# Patient Record
Sex: Male | Born: 1955 | Race: Black or African American | Hispanic: No | Marital: Single | Smoking: Current every day smoker
Health system: Southern US, Community
[De-identification: ages and names within clinical notes are randomized; demographics above are authoritative.]

## PROBLEM LIST (undated history)

## (undated) DIAGNOSIS — R7303 Prediabetes: Secondary | ICD-10-CM

## (undated) DIAGNOSIS — Z8739 Personal history of other diseases of the musculoskeletal system and connective tissue: Secondary | ICD-10-CM

## (undated) DIAGNOSIS — M0800 Unspecified juvenile rheumatoid arthritis of unspecified site: Secondary | ICD-10-CM

## (undated) DIAGNOSIS — Z8614 Personal history of Methicillin resistant Staphylococcus aureus infection: Secondary | ICD-10-CM

## (undated) DIAGNOSIS — I10 Essential (primary) hypertension: Secondary | ICD-10-CM

## (undated) DIAGNOSIS — Z96659 Presence of unspecified artificial knee joint: Secondary | ICD-10-CM

## (undated) DIAGNOSIS — K219 Gastro-esophageal reflux disease without esophagitis: Secondary | ICD-10-CM

## (undated) DIAGNOSIS — E7800 Pure hypercholesterolemia, unspecified: Secondary | ICD-10-CM

## (undated) HISTORY — DX: Essential (primary) hypertension: I10

## (undated) HISTORY — DX: Presence of unspecified artificial knee joint: Z96.659

## (undated) HISTORY — DX: Unspecified juvenile rheumatoid arthritis of unspecified site: M08.00

## (undated) HISTORY — DX: Personal history of Methicillin resistant Staphylococcus aureus infection: Z86.14

## (undated) HISTORY — DX: Gastro-esophageal reflux disease without esophagitis: K21.9

## (undated) HISTORY — DX: Prediabetes: R73.03

## (undated) HISTORY — DX: Pure hypercholesterolemia, unspecified: E78.00

## (undated) HISTORY — DX: Personal history of other diseases of the musculoskeletal system and connective tissue: Z87.39

---

## 2004-09-07 ENCOUNTER — Emergency Department: Payer: Self-pay | Admitting: Internal Medicine

## 2007-02-20 ENCOUNTER — Emergency Department: Payer: Self-pay | Admitting: Emergency Medicine

## 2008-09-15 HISTORY — PX: ELBOW SURGERY: SHX618

## 2008-09-15 HISTORY — PX: KNEE SURGERY: SHX244

## 2009-04-25 ENCOUNTER — Emergency Department: Payer: Self-pay | Admitting: Emergency Medicine

## 2009-04-27 ENCOUNTER — Inpatient Hospital Stay: Payer: Self-pay | Admitting: Internal Medicine

## 2009-06-16 ENCOUNTER — Ambulatory Visit: Payer: Self-pay

## 2009-06-29 ENCOUNTER — Emergency Department (HOSPITAL_COMMUNITY): Admission: EM | Admit: 2009-06-29 | Discharge: 2009-06-29 | Payer: Self-pay | Admitting: Emergency Medicine

## 2009-07-14 ENCOUNTER — Emergency Department (HOSPITAL_COMMUNITY): Admission: EM | Admit: 2009-07-14 | Discharge: 2009-07-14 | Payer: Self-pay | Admitting: Emergency Medicine

## 2009-07-17 ENCOUNTER — Ambulatory Visit: Payer: Self-pay | Admitting: Radiology

## 2009-09-25 ENCOUNTER — Ambulatory Visit: Payer: Self-pay

## 2009-12-20 ENCOUNTER — Emergency Department (HOSPITAL_COMMUNITY): Admission: EM | Admit: 2009-12-20 | Discharge: 2009-12-20 | Payer: Self-pay | Admitting: Emergency Medicine

## 2010-06-08 ENCOUNTER — Other Ambulatory Visit: Payer: Self-pay

## 2010-06-14 ENCOUNTER — Ambulatory Visit: Payer: Self-pay

## 2010-06-22 ENCOUNTER — Emergency Department (HOSPITAL_COMMUNITY): Admission: EM | Admit: 2010-06-22 | Discharge: 2010-06-23 | Payer: Self-pay | Admitting: Emergency Medicine

## 2010-06-23 ENCOUNTER — Other Ambulatory Visit: Payer: Self-pay

## 2010-07-04 ENCOUNTER — Ambulatory Visit (HOSPITAL_COMMUNITY): Admission: RE | Admit: 2010-07-04 | Discharge: 2010-07-04 | Payer: Self-pay | Admitting: Internal Medicine

## 2010-07-23 ENCOUNTER — Inpatient Hospital Stay: Payer: Self-pay | Admitting: Family Medicine

## 2010-07-31 ENCOUNTER — Other Ambulatory Visit: Payer: Self-pay | Admitting: Family Medicine

## 2010-08-02 ENCOUNTER — Ambulatory Visit: Payer: Self-pay | Admitting: Family Medicine

## 2010-08-05 ENCOUNTER — Other Ambulatory Visit: Payer: Self-pay | Admitting: Family Medicine

## 2010-08-09 ENCOUNTER — Ambulatory Visit: Payer: Self-pay

## 2010-08-30 ENCOUNTER — Other Ambulatory Visit: Payer: Self-pay | Admitting: Family Medicine

## 2010-12-04 LAB — CBC
HCT: 38.1 % — ABNORMAL LOW (ref 39.0–52.0)
Hemoglobin: 12.5 g/dL — ABNORMAL LOW (ref 13.0–17.0)
MCV: 76.3 fL — ABNORMAL LOW (ref 78.0–100.0)
RDW: 19.5 % — ABNORMAL HIGH (ref 11.5–15.5)

## 2010-12-04 LAB — DIFFERENTIAL
Basophils Absolute: 0 10*3/uL (ref 0.0–0.1)
Eosinophils Absolute: 0.5 10*3/uL (ref 0.0–0.7)
Eosinophils Relative: 5 % (ref 0–5)
Lymphocytes Relative: 23 % (ref 12–46)
Monocytes Absolute: 0.6 10*3/uL (ref 0.1–1.0)

## 2010-12-04 LAB — BASIC METABOLIC PANEL
CO2: 23 mEq/L (ref 19–32)
Chloride: 98 mEq/L (ref 96–112)
GFR calc non Af Amer: >60 mL/min (ref >60)
Glucose, Bld: 107 mg/dL — ABNORMAL HIGH (ref 70–99)
Potassium: 4 mEq/L (ref 3.5–5.1)
Sodium: 129 mEq/L — ABNORMAL LOW (ref 135–145)

## 2010-12-04 LAB — POCT CARDIAC MARKERS: Troponin i, poc: <0.05 ng/mL (ref 0.00–0.09)

## 2010-12-19 LAB — CBC
HCT: 26.2 % — ABNORMAL LOW (ref 39.0–52.0)
MCV: 76.2 fL — ABNORMAL LOW (ref 78.0–100.0)
Platelets: 682 10*3/uL — ABNORMAL HIGH (ref 150–400)
RDW: 19.3 % — ABNORMAL HIGH (ref 11.5–15.5)

## 2010-12-19 LAB — POCT CARDIAC MARKERS

## 2010-12-19 LAB — BASIC METABOLIC PANEL
BUN: 6 mg/dL (ref 6–23)
Chloride: 94 mEq/L — ABNORMAL LOW (ref 96–112)
GFR calc Af Amer: >60 mL/min (ref >60)
Potassium: 3.6 mEq/L (ref 3.5–5.1)

## 2010-12-19 LAB — TYPE AND SCREEN
ABO/RH(D): O POS
Antibody Screen: NEGATIVE

## 2010-12-19 LAB — DIFFERENTIAL
Basophils Absolute: 0.1 10*3/uL (ref 0.0–0.1)
Eosinophils Absolute: 0.7 10*3/uL (ref 0.0–0.7)
Eosinophils Relative: 4 % (ref 0–5)

## 2010-12-19 LAB — ABO/RH: ABO/RH(D): O POS

## 2013-04-15 DIAGNOSIS — Z96659 Presence of unspecified artificial knee joint: Secondary | ICD-10-CM

## 2013-04-15 HISTORY — DX: Presence of unspecified artificial knee joint: Z96.659

## 2013-07-01 ENCOUNTER — Ambulatory Visit: Payer: Self-pay | Admitting: Internal Medicine

## 2014-03-10 DIAGNOSIS — Z792 Long term (current) use of antibiotics: Secondary | ICD-10-CM | POA: Insufficient documentation

## 2015-01-05 NOTE — Op Note (Signed)
PATIENT NAME:  Daniel Ray, Daniel Ray MR#:  [id number] DATE OF BIRTH:  1956-07-09  DGearldine BienenstockE OF PROCEDURE:  07/01/2013  PREOPERATIVE DIAGNOSES:  Methicillin-resistant Staphylococcus aureus infection.   POSTOPERATIVE DIAGNOSIS:  Methicillin-resistant Staphylococcus aureus infection.  PROCEDURES:  1. Ultrasound guidance for vascular access to right brachial vein.  2. Fluoroscopic guidance for placement of catheter.  3. Insertion of peripherally inserted central venous catheter, single lumen PICC line, right arm.  SURGEON: Levora DredgeGregory Schnier. MD  ANESTHESIA: Local.   ESTIMATED BLOOD LOSS: Minimal.   INDICATION FOR PROCEDURE: Requiring IV antibiotics greater than 5 days.   DESCRIPTION OF PROCEDURE: The patient's right arm was sterilely prepped and draped, and a sterile surgical field was created. The brachial vein was accessed under direct ultrasound guidance without difficulty with a micropuncture needle and permanent image was recorded. 0.018 wire was then placed into the superior vena cava. Peel-away sheath was placed over the wire. A single lumen peripherally inserted central venous catheter was then placed over the wire and the wire and peel-away sheath were removed. The catheter tip was placed into the superior vena cava and was secured at the skin at 40 cm with a sterile dressing. The catheter withdrew blood well and flushed easily with heparinized saline. The patient tolerated procedure well.    ____________________________ Renford DillsGregory G. Schnier, MD ggs:dp D: 07/01/2013 13:52:37 ET T: 07/01/2013 14:22:26 ET JOB#: [id number]  cc: Renford DillsGregory G. Schnier, MD, <Dictator> Renford DillsGREGORY G SCHNIER MD ELECTRONICALLY SIGNED 07/22/2013 8:14

## 2015-07-12 DIAGNOSIS — I10 Essential (primary) hypertension: Secondary | ICD-10-CM | POA: Insufficient documentation

## 2015-07-12 HISTORY — DX: Essential (primary) hypertension: I10

## 2015-08-03 ENCOUNTER — Encounter: Admission: RE | Payer: Self-pay | Source: Ambulatory Visit

## 2015-08-03 ENCOUNTER — Ambulatory Visit
Admission: RE | Admit: 2015-08-03 | Payer: Medicare Other | Source: Ambulatory Visit | Admitting: Unknown Physician Specialty

## 2015-08-03 SURGERY — COLONOSCOPY WITH PROPOFOL
Anesthesia: General

## 2016-01-10 DIAGNOSIS — F17200 Nicotine dependence, unspecified, uncomplicated: Secondary | ICD-10-CM | POA: Insufficient documentation

## 2016-07-14 DIAGNOSIS — E7800 Pure hypercholesterolemia, unspecified: Secondary | ICD-10-CM

## 2016-07-14 DIAGNOSIS — Z7189 Other specified counseling: Secondary | ICD-10-CM | POA: Insufficient documentation

## 2016-07-14 DIAGNOSIS — R7303 Prediabetes: Secondary | ICD-10-CM

## 2016-07-14 DIAGNOSIS — Z7185 Encounter for immunization safety counseling: Secondary | ICD-10-CM | POA: Insufficient documentation

## 2016-07-14 HISTORY — DX: Pure hypercholesterolemia, unspecified: E78.00

## 2016-07-14 HISTORY — DX: Prediabetes: R73.03

## 2016-11-28 ENCOUNTER — Other Ambulatory Visit: Payer: Self-pay | Admitting: Nurse Practitioner

## 2016-11-28 DIAGNOSIS — R945 Abnormal results of liver function studies: Secondary | ICD-10-CM

## 2016-11-28 DIAGNOSIS — Z789 Other specified health status: Secondary | ICD-10-CM

## 2016-11-28 DIAGNOSIS — R1011 Right upper quadrant pain: Secondary | ICD-10-CM

## 2016-11-28 DIAGNOSIS — R7989 Other specified abnormal findings of blood chemistry: Secondary | ICD-10-CM

## 2016-11-28 DIAGNOSIS — K219 Gastro-esophageal reflux disease without esophagitis: Secondary | ICD-10-CM

## 2016-12-03 ENCOUNTER — Other Ambulatory Visit: Payer: Medicare Other

## 2016-12-03 ENCOUNTER — Ambulatory Visit: Payer: Medicare Other

## 2016-12-11 ENCOUNTER — Ambulatory Visit
Admission: RE | Admit: 2016-12-11 | Discharge: 2016-12-11 | Disposition: A | Discharge status: Home / Self Care | Payer: Medicare Other | Source: Ambulatory Visit | Attending: Nurse Practitioner | Admitting: Nurse Practitioner

## 2016-12-11 DIAGNOSIS — R1011 Right upper quadrant pain: Secondary | ICD-10-CM

## 2016-12-11 DIAGNOSIS — K219 Gastro-esophageal reflux disease without esophagitis: Secondary | ICD-10-CM

## 2016-12-11 DIAGNOSIS — Z789 Other specified health status: Secondary | ICD-10-CM

## 2016-12-11 DIAGNOSIS — R7989 Other specified abnormal findings of blood chemistry: Secondary | ICD-10-CM

## 2016-12-11 DIAGNOSIS — N281 Cyst of kidney, acquired: Secondary | ICD-10-CM | POA: Diagnosis not present

## 2016-12-11 DIAGNOSIS — R945 Abnormal results of liver function studies: Secondary | ICD-10-CM

## 2017-01-15 ENCOUNTER — Ambulatory Visit (INDEPENDENT_AMBULATORY_CARE_PROVIDER_SITE_OTHER): Payer: Medicare Other | Admitting: Urology

## 2017-01-15 ENCOUNTER — Encounter: Payer: Self-pay | Admitting: Urology

## 2017-01-15 VITALS — BP 152/89 | HR 63 | Ht 65.0 in | Wt 240.0 lb

## 2017-01-15 DIAGNOSIS — N281 Cyst of kidney, acquired: Secondary | ICD-10-CM

## 2017-01-15 DIAGNOSIS — Z8614 Personal history of Methicillin resistant Staphylococcus aureus infection: Secondary | ICD-10-CM

## 2017-01-15 DIAGNOSIS — Z8739 Personal history of other diseases of the musculoskeletal system and connective tissue: Secondary | ICD-10-CM

## 2017-01-15 DIAGNOSIS — M0800 Unspecified juvenile rheumatoid arthritis of unspecified site: Secondary | ICD-10-CM

## 2017-01-15 DIAGNOSIS — K219 Gastro-esophageal reflux disease without esophagitis: Secondary | ICD-10-CM

## 2017-01-15 HISTORY — DX: Gastro-esophageal reflux disease without esophagitis: K21.9

## 2017-01-15 HISTORY — DX: Unspecified juvenile rheumatoid arthritis of unspecified site: M08.00

## 2017-01-15 HISTORY — DX: Personal history of other diseases of the musculoskeletal system and connective tissue: Z87.39

## 2017-01-15 HISTORY — DX: Personal history of Methicillin resistant Staphylococcus aureus infection: Z86.14

## 2017-01-15 NOTE — Progress Notes (Signed)
01/15/2017 8:52 AM   [name] [birth year] [id number]  Referring provider: Raynelle Bring [address] [place], [place] [address]  Chief Complaint  Patient presents with  . New Patient (Initial Visit)    Renal Cyst    HPI: 61 year old male with a history of juvenile arthritis who presents today for follow-up of incidental renal cysts identified on ultrasound.   He underwent further workup for elevated LFTs including right upper quadrant ultrasound at which time 2 posterior neck to renal) were identified, one measuring 2.5 x 3.2 cm with a thin septation, the other measuring 5.4 x 5.7 with a tiny peripheral calcification, both on the left kidney. He has additional Bosniak I renal cysts.    He denies any flank pain, gross hematuria, dysuria, or any other symptoms. He's never been told that his renal) in the past.  He has no family history of abnormal renal cyst or renal cell carcinoma.  Creatinine 0.8 on 07/2016.  PMH: Past Medical History:  Diagnosis Date  . Borderline diabetes mellitus 07/14/2016  . Essential hypertension 07/12/2015  . GERD (gastroesophageal reflux disease) 01/15/2017  . 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 History of MRSA infection 01/15/2017   Overview:  Septic arthritis (suppressive abx therapy)  . 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 Pure hypercholesterolemia 07/14/2016  . S/P knee replacement 04/15/2013    Surgical History: Past Surgical History:  Procedure Laterality Date  . ELBOW SURGERY  2010   removal of joint  . KNEE SURGERY  2010    Home Medications:  Allergies as of 01/15/2017   No Known Allergies     Medication List       Accurate as of 01/15/17 11:59 PM. Always use your most recent med list.          doxycycline 100 MG tablet Commonly known as:   VIBRA-TABS Take by mouth.   hydrochlorothiazide 12.5 MG tablet Commonly known as:  HYDRODIURIL take 1 tablet by mouth once daily   naproxen sodium 220 MG tablet Commonly known as:  ANAPROX Take by mouth.   omeprazole 40 MG capsule Commonly known as:  PRILOSEC take 1 capsule by mouth once daily if needed       Allergies: No Known Allergies  Family History: Family History  Problem Relation Age of Onset  . Kidney cancer Neg Hx   . Bladder Cancer Neg Hx   . Prostate cancer Neg Hx     Social History:  reports that he has been smoking.  He has never used smokeless tobacco. He reports that he drinks alcohol. He reports that he does not use drugs.  ROS: UROLOGY Frequent Urination?: No Hard to postpone urination?: No Burning/pain with urination?: No Get up at night to urinate?: Yes Leakage of urine?: No Urine stream starts and stops?: No Trouble starting stream?: No Do you have to strain to urinate?: No Blood in urine?: No Urinary tract infection?: No Sexually transmitted disease?: No Injury to kidneys or bladder?: No Painful intercourse?: No Weak stream?: No Erection problems?: No Penile pain?: No  Gastrointestinal Nausea?: No Vomiting?: No Indigestion/heartburn?: Yes Diarrhea?: No Constipation?: No  Constitutional Fever: No Night sweats?: No Weight loss?: No Fatigue?: No  Skin Skin rash/lesions?: No Itching?: No  Eyes Blurred vision?: No Double vision?: No  Ears/Nose/Throat Sore throat?: No Sinus problems?: No  Hematologic/Lymphatic Swollen glands?: No Easy bruising?: No  Cardiovascular Leg swelling?: No Chest pain?: No  Respiratory Cough?: No Shortness of breath?: No  Endocrine Excessive thirst?: No  Musculoskeletal Back pain?: No Joint pain?: No  Neurological Headaches?: No Dizziness?: No  Psychologic Depression?: No Anxiety?: No  Physical Exam: BP (!) 152/89   Pulse 63   Ht 5\' 5"  (1.651 m)   Wt 240 lb (108.9 kg)    BMI 39.94 kg/m   Constitutional:  Alert and oriented, No acute distress.  [id number]  HEENT: [place] AT, moist mucus membranes.  Trachea midline, no masses. Cardiovascular: No clubbing, cyanosis, or edema. Respiratory: Normal respiratory effort, no increased work of breathing. GI: Abdomen is soft, nontender, nondistended, no abdominal masses GU: No CVA tenderness. * Skin: No rashes, bruises or suspicious lesions. Lymph: No cervical or inguinal adenopathy. Neurologic: Grossly intact, no focal deficits, moving all 4 extremities. Psychiatric: Normal mood and affect.  Laboratory Data: Lab Results  Component Value Date   WBC 9.3 12/20/2009   HGB 12.5 (L) 12/20/2009   HCT 38.1 (L) 12/20/2009   MCV 76.3 (L) 12/20/2009   PLT 425 (H) 12/20/2009    Lab Results  Component Value Date   CREATININE 0.65 12/20/2009    Urinalysis No results found for: COLORURINE, APPEARANCEUR, LABSPEC, PHURINE, GLUCOSEU, HGBUR, BILIRUBINUR, KETONESUR, PROTEINUR, UROBILINOGEN, NITRITE, LEUKOCYTESUR  Pertinent Imaging: CLINICAL DATA:  Right upper quadrant pain, increased LFTs  EXAM: ABDOMEN ULTRASOUND COMPLETE  COMPARISON:  Abdominal ultrasound 05/03/2009  FINDINGS: Gallbladder: No gallstones or wall thickening visualized. No sonographic Murphy sign noted by sonographer.  Common bile duct: Diameter: 6 mm  Liver: No focal lesion identified. Mild increased echogenicity of the liver suspicious for fatty infiltration.  IVC: No abnormality visualized.  Pancreas: Visualized portion unremarkable. Limited assessment pancreatic head and tail region.  Spleen: Size and appearance within normal limits. Measures 5.4 cm in length  Right Kidney: Length: 12.7 cm. Normal echogenicity. No hydronephrosis. There is a cyst in lower pole measures 2 x 1.9 cm.  Left Kidney: Length: 11.9 cm in length. Normal echogenicity. No hydronephrosis. A complex cyst with a septation in upper pole measures 2.5 by 3.2 cm. A  cyst in lower pole with a tiny peripheral calcification measures 5.4 by 5.7 cm.  Abdominal aorta: No aneurysm visualized. Measures up to 2.8 cm in diameter.  Other findings: None.  IMPRESSION: 1. No gallstones are noted within gallbladder.  Normal CBD. 2. Mild increased echogenicity of the liver suspicious for fatty infiltration. No focal hepatic mass. 3. No hydronephrosis. Cyst in lower pole of the right kidney measures 2 cm. There is a cyst with a septation upper pole of the left kidney measures 2.5 x 3.2 cm. Cyst in lower pole of the left kidney with a tiny peripheral calcification measures 5.7 x 5.4 cm.   Electronically Signed   By: Natasha MeadLiviu  Pop M.D.   On: 12/11/2016 12:45  RUS personally reivewed today  Assessment & Plan:    1. 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  Bosniak 1 is a simple cyst and requires no further follow-up, Bosniakt 2 which includes some lesions which need to be followed and others which don't, partially 5% chance of malignancy for 60F lesions, Bosniak 3 lesions harbor a 50% chance of malignancy whereas Bosniak 4 cysts have a solid and 90-95% are malignant in nature.   We will plan for a dedicated renal ultrasound in  6 months to assess for stability and to ensure that these are indeed Bosniak 2 renal cysts,. If confirmed on repeat imaging, no further follow-up needed.   Patient is agreeable with this plan.  - US Renal; Future    Return in about 6 months (around 07/18/2017) for f/u renal ultrasound.  Vanna Scotland, MD  [facility] [address], [address] [place], [place] [address] [phone]

## 2017-07-17 ENCOUNTER — Ambulatory Visit
Admission: RE | Admit: 2017-07-17 | Discharge: 2017-07-17 | Disposition: A | Discharge status: Home / Self Care | Payer: Medicare Other | Source: Ambulatory Visit | Attending: Urology | Admitting: Urology

## 2017-07-17 DIAGNOSIS — N281 Cyst of kidney, acquired: Secondary | ICD-10-CM | POA: Insufficient documentation

## 2017-07-22 ENCOUNTER — Encounter: Payer: Self-pay | Admitting: Urology

## 2017-07-22 ENCOUNTER — Ambulatory Visit (INDEPENDENT_AMBULATORY_CARE_PROVIDER_SITE_OTHER): Payer: Medicare Other | Admitting: Urology

## 2017-07-22 VITALS — BP 140/90 | HR 76 | Ht 64.0 in | Wt 210.0 lb

## 2017-07-22 DIAGNOSIS — N281 Cyst of kidney, acquired: Secondary | ICD-10-CM

## 2017-07-22 NOTE — Progress Notes (Signed)
07/22/2017 7:16 PM   Daniel Ray 14-Apr-1956 [id number]  [facility] [address] [place], [place] [id number]  Chief Complaint  Patient presents with  . Results    41month w/RUS    HPI: [age] year old male with a history of juvenile arthritis who returns today for follow-up of incidental renal cysts identified on ultrasound.   He underwent further workup for elevated LFTs including right upper quadrant ultrasound on 11/2016 at which time 2 posterior neck to renal) were identified, one measuring 2.5 x 3.2 cm with a thin septation, the other measuring 5.4 x 5.7 with a tiny peripheral calcification, both on the left kidney. He has additional Bosniak I renal cysts.     Follow-up repeat renal ultrasound on 07/17/2017 shows stable renal cyst without change.    He denies any flank pain, gross hematuria, dysuria, or any other symptoms. He's never been told that his renal) in the past.  He has no family history of abnormal renal cyst or renal cell carcinoma.  He continues to drink at least 4 beers at minimum on a daily basis and smoke.   PMH: Past Medical History:  Diagnosis Date  . Borderline diabetes mellitus 07/14/2016  . Essential hypertension 07/12/2015  . GERD (gastroesophageal reflux disease) 01/15/2017  . 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 History of MRSA infection 01/15/2017   Overview:  Septic arthritis (suppressive abx therapy)  . 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 Pure hypercholesterolemia 07/14/2016  . S/P knee replacement 04/15/2013    Surgical History: Past Surgical History:  Procedure Laterality Date  . ELBOW SURGERY  2010   removal of joint  . KNEE SURGERY  2010    Home Medications:  Allergies as of 07/22/2017   No Known Allergies     Medication List        Accurate as of 07/22/17 11:59 PM. Always use your most recent med list.          doxycycline 100 MG tablet Commonly known as:  VIBRA-TABS Take by mouth.   hydrochlorothiazide 12.5 MG tablet Commonly known as:  HYDRODIURIL take 1 tablet by mouth once daily   lovastatin 20 MG tablet Commonly known as:  MEVACOR   naproxen sodium 220 MG tablet Commonly known as:  ALEVE Take by mouth.   omeprazole 40 MG capsule Commonly known as:  PRILOSEC take 1 capsule by mouth once daily if needed       Allergies: No Known Allergies  Family History: Family History  Problem Relation Age of Onset  . Kidney cancer Neg Hx   . Bladder Cancer Neg Hx   . Prostate cancer Neg Hx     Social History:  reports that he has been smoking.  he has never used smokeless tobacco. He reports that he drinks alcohol. He reports that he does not use drugs.  ROS: UROLOGY Frequent Urination?: No Hard to postpone urination?: No Burning/pain with urination?: No Get up at night to urinate?: No Leakage of urine?: Yes Urine stream starts and stops?: No Trouble starting stream?: No Do you have to strain to urinate?: No Blood in urine?: No Urinary tract infection?: No Sexually transmitted disease?: No Injury to kidneys or bladder?: No Painful intercourse?: No Weak stream?: No Erection problems?: No Penile pain?: No  Gastrointestinal Nausea?: No Vomiting?: No Indigestion/heartburn?: Yes Diarrhea?:  No Constipation?: No  Constitutional Fever: No Night sweats?: No Weight loss?: No Fatigue?: No  Skin Skin rash/lesions?: No Itching?: No  Eyes Blurred vision?: No Double vision?: No  Ears/Nose/Throat Sore throat?: No Sinus problems?: Yes  Hematologic/Lymphatic Swollen glands?: No Easy bruising?: No  Cardiovascular Leg swelling?: No Chest pain?: No  Respiratory Cough?: No Shortness of breath?: No  Endocrine Excessive thirst?: No  Musculoskeletal Back pain?: Yes Joint pain?:  Yes  Neurological Headaches?: No Dizziness?: No  Psychologic Depression?: No Anxiety?: No  Physical Exam: BP 140/90   Pulse 76   Ht 5\' 4"  (1.626 m)   Wt 210 lb (95.3 kg)   BMI 36.05 kg/m   Constitutional:  Alert and oriented, No acute distress.  In wheel chair.  Somewhat malodorous.    HEENT: [place] AT, moist mucus membranes.  Trachea midline, no masses. Cardiovascular: No clubbing, cyanosis, or edema. Respiratory: Normal respiratory effort, no increased work of breathing. GI: Abdomen is soft, nontender, nondistended, no abdominal masses Skin: No rashes, bruises or suspicious lesions. Psychiatric: Normal mood and affect.  Laboratory Data: Lab Results  Component Value Date   WBC 9.3 12/20/2009   HGB 12.5 (L) 12/20/2009   HCT 38.1 (L) 12/20/2009   MCV 76.3 (L) 12/20/2009   PLT 425 (H) 12/20/2009    Lab Results  Component Value Date   CREATININE 0.65 12/20/2009    Urinalysis N/a  Pertinent Imaging: CLINICAL DATA:  Renal cyst.  EXAM: RENAL / URINARY TRACT ULTRASOUND COMPLETE  COMPARISON:  Ultrasound 12/11/2016.  FINDINGS: Right Kidney:  Length: 12.5 cm. Echogenicity within normal limits. No hydronephrosis visualized. 2.4 cm simple cyst right kidney.  Left Kidney:  Length: 12.7 cm. Echogenicity within normal limits. No hydronephrosis visualized. 3.3 cm cyst noted in the upper left kidney. A thin septation again may be present. 6.1 cm cyst noted in the lower pole of the left kidney. No prominent calcification noted on today's exam . The cyst are consistent with benign cyst.  Bladder:  Mild bladder wall thickening cannot be excluded. Process such as cystitis cannot be excluded.  IMPRESSION: 1. Stable cysts both kidneys as above. The cysts are most likely benign. No acute renal abnormality. No hydronephrosis.  2. Mild bladder wall thickening cannot be excluded. And active bladder process such as cystitis cannot be excluded.  Clinical correlation suggested . No bladder distention .   Electronically Signed   By: Maisie Fushomas  Register   On: 07/17/2017 13:01  RUS personally reivewed today.  Bladder is under distended therefore thickening cannot be assessed.    Assessment & Plan:    1. Renal cyst Multiple bilateral incidental renal cysto consistent with Bosniak 1 and 2 largest 6.1 cm on left.    These appear benign and are stable in size and conformation x 6 months.  No further evaluation or serial imaging indicated at time time.  All questions answered.    Return if symptoms worsen or fail to improve.  Vanna ScotlandAshley Elvira Langston, MD  [facility] [address] [place], [place] [address] [phone]

## 2020-11-09 ENCOUNTER — Emergency Department: Payer: Medicare Other

## 2020-11-09 ENCOUNTER — Other Ambulatory Visit: Payer: Self-pay

## 2020-11-09 ENCOUNTER — Emergency Department
Admission: EM | Admit: 2020-11-09 | Discharge: 2020-11-09 | Disposition: A | Discharge status: Home / Self Care | Payer: Medicare Other | Attending: Student in an Organized Health Care Education/Training Program | Admitting: Student in an Organized Health Care Education/Training Program

## 2020-11-09 DIAGNOSIS — Z79899 Other long term (current) drug therapy: Secondary | ICD-10-CM | POA: Insufficient documentation

## 2020-11-09 DIAGNOSIS — Z452 Encounter for adjustment and management of vascular access device: Secondary | ICD-10-CM | POA: Diagnosis present

## 2020-11-09 DIAGNOSIS — I878 Other specified disorders of veins: Secondary | ICD-10-CM

## 2020-11-09 DIAGNOSIS — F17200 Nicotine dependence, unspecified, uncomplicated: Secondary | ICD-10-CM | POA: Diagnosis not present

## 2020-11-09 DIAGNOSIS — I10 Essential (primary) hypertension: Secondary | ICD-10-CM | POA: Insufficient documentation

## 2020-11-09 HISTORY — PX: IR REMOVAL TUN CV CATH W/O FL: IMG2289

## 2020-11-09 NOTE — Procedures (Signed)
Successful removal of tunneled (R)IJ CVC No complications.  Brayton El PA-C Interventional Radiology 11/09/2020 2:32 PM

## 2020-11-09 NOTE — ED Provider Notes (Signed)
[facility] Emergency Department Provider Note    Event Date/Time   First MD Initiated Contact with Patient 11/09/20 1253     (approximate)  I have reviewed the triage vital signs and the nursing notes.   HISTORY  Chief Complaint Vascular Access Problem    HPI Daniel Ray is a 65 y.o. male below listed past medical history presents to the ER for evaluation of nonfunctioning tunneled catheter.  Patient received his last dose of IV vancomycin last night without any issues.  Was sent to the ER today because nonfunctioning.  No pain.  No other concerns.  Nursing facility tried removing this.  Per review of the records it seems that Dr. Constance Goltz had recommended patient to have follow-up with interventional radiology for removal of the catheter but nurse at [facility] states that they do not have anything scheduled.  Past Medical History:  Diagnosis Date  . Borderline diabetes mellitus 07/14/2016  . Essential hypertension 07/12/2015  . GERD (gastroesophageal reflux disease) 01/15/2017  . 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 History of MRSA infection 01/15/2017   Overview:  Septic arthritis (suppressive abx therapy)  . 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 Pure hypercholesterolemia 07/14/2016  . S/P knee replacement 04/15/2013   Family History  Problem Relation Age of Onset  . Kidney cancer Neg Hx   . Bladder Cancer Neg Hx   . Prostate cancer Neg Hx    Past Surgical History:  Procedure Laterality Date  . ELBOW SURGERY  2010   removal of joint  . KNEE SURGERY  2010   Patient Active Problem List   Diagnosis Date Noted  . 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 History of MRSA infection 01/15/2017  . 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 GERD  (gastroesophageal reflux disease) 01/15/2017  . Vaccine counseling 07/14/2016  . Pure hypercholesterolemia 07/14/2016  . Borderline diabetes mellitus 07/14/2016  . Tobacco dependence 01/10/2016  . Essential hypertension 07/12/2015  . Long term current use of antibiotics 03/10/2014  . S/P knee replacement 04/15/2013      Prior to Admission medications   Medication Sig Start Date End Date Taking? Authorizing Provider  doxycycline (VIBRA-TABS) 100 MG tablet Take by mouth. 07/14/16   [provider]  hydrochlorothiazide (HYDRODIURIL) 12.5 MG tablet take 1 tablet by mouth once daily 05/22/16   [provider]  lovastatin (MEVACOR) 20 MG tablet  06/10/17   [provider]  naproxen sodium (ANAPROX) 220 MG tablet Take by mouth.    [provider]  omeprazole (PRILOSEC) 40 MG capsule take 1 capsule by mouth once daily if needed 09/22/16   [provider]    Allergies Patient has no known allergies.    Social History Social History   Tobacco Use  . Smoking status: Current Every Day Smoker  . Smokeless tobacco: Never Used  Substance Use Topics  . Alcohol use: Yes  . Drug use: No    Review of Systems Patient denies headaches, rhinorrhea, blurry vision, numbness, shortness of breath, chest pain, edema, cough, abdominal pain, nausea, vomiting, diarrhea, dysuria, fevers, rashes or hallucinations unless otherwise stated above in HPI. ____________________________________________   PHYSICAL EXAM:  VITAL SIGNS: Vitals:   11/09/20 1257 11/09/20 1400  BP: (!) 156/91 134/82  Pulse: 82 69  Resp:  18 19  Temp: 98.1 F (36.7 C)   SpO2: 100% 100%    Constitutional: Alert and oriented. Well appearing and in no acute distress. Eyes: Conjunctivae are normal.  Head: Atraumatic. Nose: No congestion/rhinnorhea. Mouth/Throat: Mucous membranes are moist.   Neck: Painless ROM.  Cardiovascular:   Good peripheral circulation.  Right tunneled catheter in  RU  thorax  Respiratory: Normal respiratory effort.  No retractions.  Gastrointestinal: Soft and nontender.  Musculoskeletal: No lower extremity tenderness .  No joint effusions. Neurologic:  Normal speech and language. No gross focal neurologic deficits are appreciated.  Skin:  Skin is warm, dry and intact. No rash noted. Psychiatric: Mood and affect are normal. Speech and behavior are normal.  ____________________________________________   LABS (all labs ordered are listed, but only abnormal results are displayed)  No results found for this or any previous visit (from the past 24 hour(s)). ____________________________________________  ____________________________________________  RADIOLOGY  I personally reviewed all radiographic images ordered to evaluate for the above acute complaints and reviewed radiology reports and findings.  These findings were personally discussed with the patient.  Please see medical record for radiology report.  ____________________________________________   PROCEDURES  Procedure(s) performed:  Procedures    Critical Care performed: no ____________________________________________   INITIAL IMPRESSION / ASSESSMENT AND PLAN / ED COURSE  Pertinent labs & imaging results that were available during my care of the patient were reviewed by me and considered in my medical decision making (see chart for details).  DDX: Tunneled cath removal  Daniel Ray is a 65 y.o. who presents to the ED with PICC line intact there was was to be DC'd today.  PICC line successfully removed by IR no complications.  Stable for discharge home      ____________________________________________   FINAL CLINICAL IMPRESSION(S) / ED DIAGNOSES  Final diagnoses:  PICC (peripherally inserted central catheter) removal      NEW MEDICATIONS STARTED DURING THIS VISIT:  New Prescriptions   No medications on file     Note:  This document was prepared using Dragon  voice recognition software and may include unintentional dictation errors.     Willy Eddy, MD 11/09/20 1447

## 2020-11-09 NOTE — ED Notes (Signed)
D/C discussed with pt and Marylene Land from [facility]. All questions answered. EMS here to transport pt. NAD noted.

## 2020-11-09 NOTE — ED Notes (Signed)
This RN at bedside to answer call bell. Pt provided urinal at this time. Pt denies any further needs

## 2020-11-09 NOTE — Discharge Instructions (Addendum)
Follow up with PCP

## 2020-11-09 NOTE — ED Triage Notes (Signed)
Pt to ED via [facility]. Pt currently there for rehab on his knee and to receive antibiotics through PICC. Facility stating trying to administer antibiotics last night and had difficulty flushing it. Facility stating his morning PICC would not flush at all. Pt sent for PICC line removal. Pt stating just feels like there is a bubble stuck in there. VSS. Pt with no other complaints.

## 2020-11-09 NOTE — ED Notes (Signed)
Report to Marylene Land at [facility]

## 2020-12-24 ENCOUNTER — Other Ambulatory Visit: Payer: Self-pay | Admitting: Internal Medicine

## 2021-10-19 IMAGING — DX DG CHEST 1V
1 series · 1 of 1 positions shown · non-contrast
Comparison: November 09, 2020 study obtained earlier in the day

CLINICAL DATA: Central catheter removal

EXAM:
CHEST  1 VIEW

[chest ap]
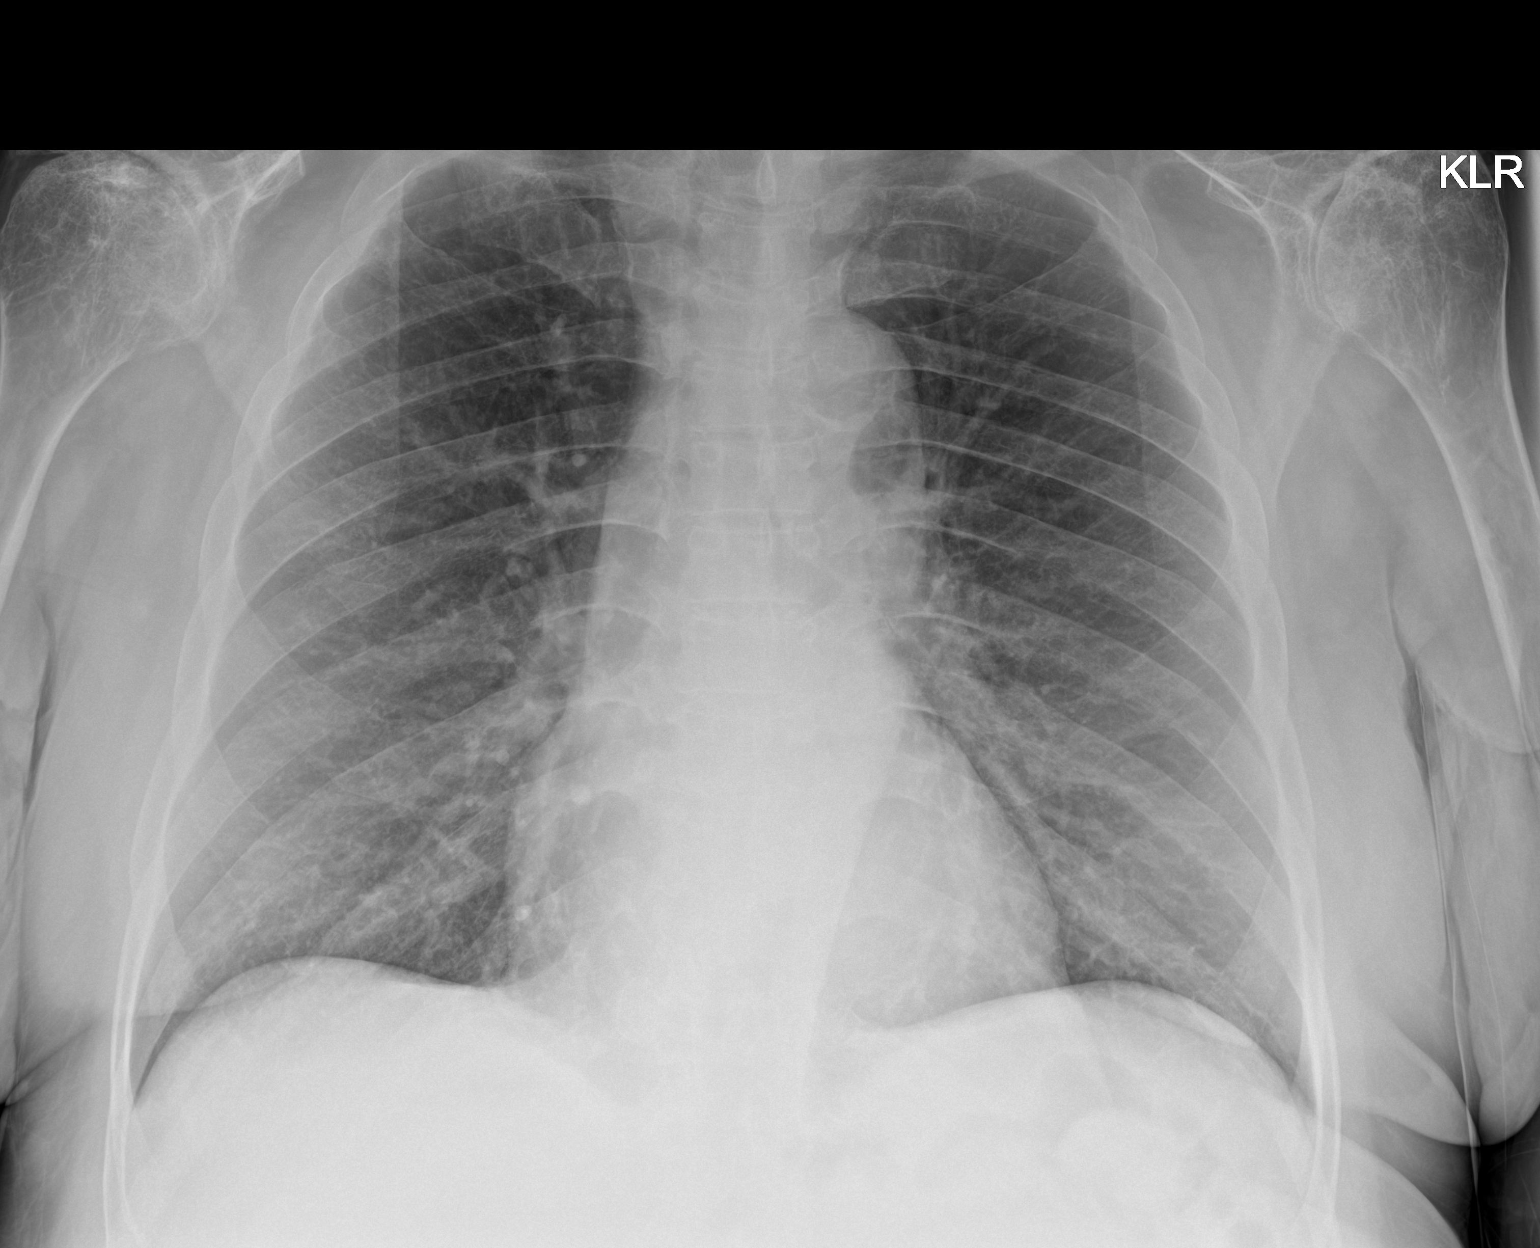

[1 of 1 positions shown; findings below may reference images not displayed]

FINDINGS: Right central catheter has been removed. No pneumothorax. Lungs
clear. Heart size and pulmonary vascularity normal. No adenopathy.
There is aortic atherosclerosis. There is degenerative change in the
thoracic spine.
IMPRESSION: Central catheter removed without pneumothorax. Lungs clear. Cardiac
silhouette normal. Aortic Atherosclerosis ([id number]).

## 2021-10-19 IMAGING — DX DG CHEST 1V PORT
1 series · 1 of 1 positions shown · non-contrast
Comparison: July 23, 2020

CLINICAL DATA: Difficulty with flushing central catheter

EXAM:
PORTABLE CHEST 1 VIEW

[chest ap]
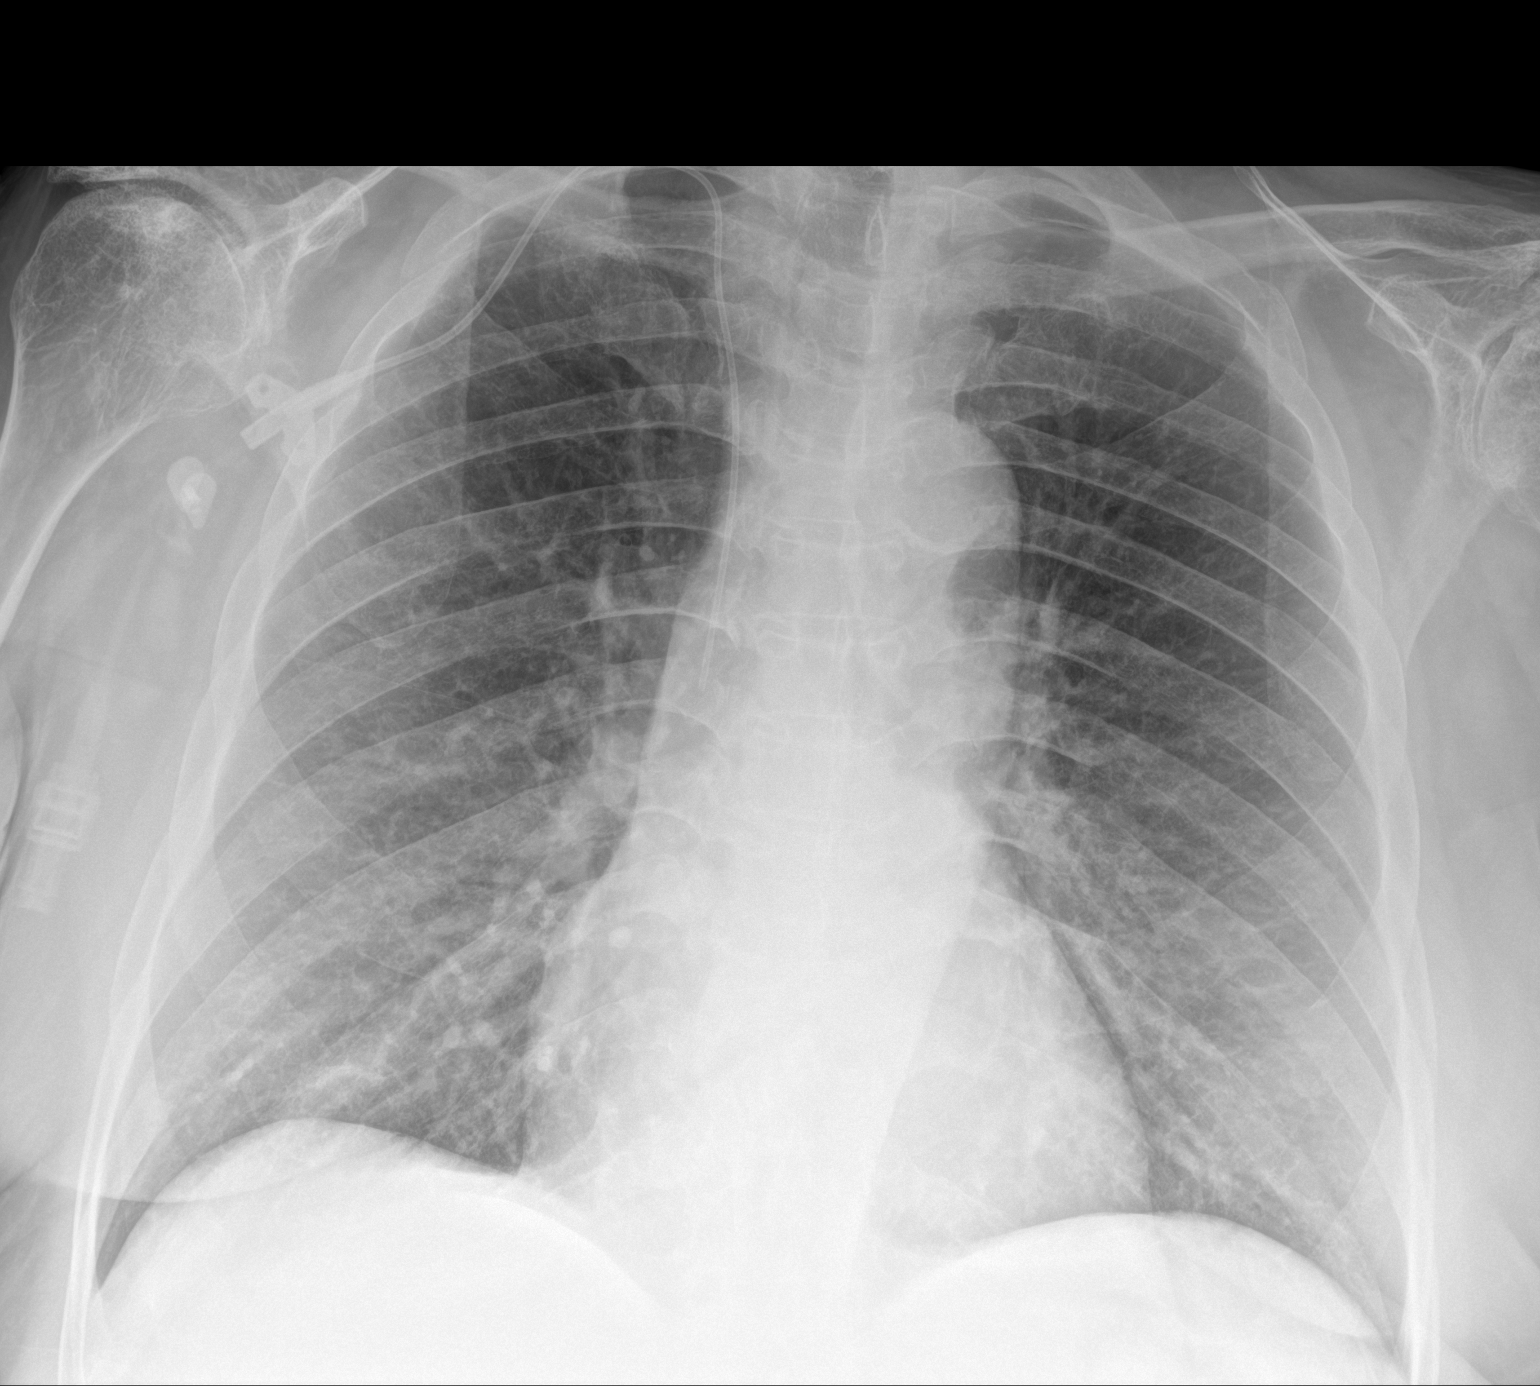

[1 of 1 positions shown; findings below may reference images not displayed]

FINDINGS: Central catheter tip is in the superior vena cava. No pneumothorax.
The lungs are clear. Heart size and pulmonary vascularity are
normal. No adenopathy. There is aortic atherosclerosis. There is
degenerative change in each shoulder.
IMPRESSION: Central catheter tip in superior vena cava. No pneumothorax. Lungs
clear. Cardiac silhouette normal. Aortic Atherosclerosis
([id number]).

## 2022-10-01 ENCOUNTER — Other Ambulatory Visit: Payer: Self-pay | Admitting: Nurse Practitioner

## 2022-10-01 DIAGNOSIS — K219 Gastro-esophageal reflux disease without esophagitis: Secondary | ICD-10-CM

## 2022-10-01 DIAGNOSIS — R7989 Other specified abnormal findings of blood chemistry: Secondary | ICD-10-CM

## 2022-10-01 DIAGNOSIS — K700 Alcoholic fatty liver: Secondary | ICD-10-CM

## 2022-10-03 ENCOUNTER — Other Ambulatory Visit: Payer: Self-pay | Admitting: Nurse Practitioner

## 2022-10-03 DIAGNOSIS — K219 Gastro-esophageal reflux disease without esophagitis: Secondary | ICD-10-CM

## 2022-10-10 ENCOUNTER — Ambulatory Visit: Payer: 59

## 2022-10-10 ENCOUNTER — Other Ambulatory Visit: Payer: Medicare Other

## 2022-10-15 ENCOUNTER — Ambulatory Visit: Payer: 59

## 2022-10-15 ENCOUNTER — Inpatient Hospital Stay: Admission: RE | Admit: 2022-10-15 | Payer: 59 | Source: Ambulatory Visit

## 2022-10-23 ENCOUNTER — Ambulatory Visit: Payer: 59

## 2022-10-31 ENCOUNTER — Ambulatory Visit
Admission: RE | Admit: 2022-10-31 | Discharge: 2022-10-31 | Disposition: A | Discharge status: Home / Self Care | Payer: 59 | Source: Ambulatory Visit | Attending: Nurse Practitioner | Admitting: Nurse Practitioner

## 2022-10-31 DIAGNOSIS — K219 Gastro-esophageal reflux disease without esophagitis: Secondary | ICD-10-CM | POA: Insufficient documentation

## 2022-10-31 DIAGNOSIS — R7989 Other specified abnormal findings of blood chemistry: Secondary | ICD-10-CM | POA: Insufficient documentation

## 2022-10-31 DIAGNOSIS — K700 Alcoholic fatty liver: Secondary | ICD-10-CM | POA: Diagnosis present

## 2023-12-01 ENCOUNTER — Other Ambulatory Visit: Payer: Self-pay | Admitting: Nurse Practitioner

## 2023-12-01 DIAGNOSIS — R7989 Other specified abnormal findings of blood chemistry: Secondary | ICD-10-CM

## 2023-12-01 DIAGNOSIS — K760 Fatty (change of) liver, not elsewhere classified: Secondary | ICD-10-CM

## 2023-12-15 ENCOUNTER — Ambulatory Visit: Admission: RE | Admit: 2023-12-15 | Source: Ambulatory Visit

## 2024-01-18 ENCOUNTER — Ambulatory Visit

## 2024-10-06 ENCOUNTER — Other Ambulatory Visit: Payer: Self-pay | Admitting: Gastroenterology

## 2024-10-06 DIAGNOSIS — K760 Fatty (change of) liver, not elsewhere classified: Secondary | ICD-10-CM

## 2024-10-06 DIAGNOSIS — F1099 Alcohol use, unspecified with unspecified alcohol-induced disorder: Secondary | ICD-10-CM

## 2024-10-14 ENCOUNTER — Ambulatory Visit
